# Patient Record
Sex: Male | Born: 1975
Health system: Southern US, Community
[De-identification: ages and names within clinical notes are randomized; demographics above are authoritative.]

## PROBLEM LIST (undated history)

## (undated) DIAGNOSIS — I340 Nonrheumatic mitral (valve) insufficiency: Secondary | ICD-10-CM

## (undated) DIAGNOSIS — Z800 Family history of malignant neoplasm of digestive organs: Secondary | ICD-10-CM

## (undated) DIAGNOSIS — H269 Unspecified cataract: Secondary | ICD-10-CM

## (undated) DIAGNOSIS — I480 Paroxysmal atrial fibrillation: Secondary | ICD-10-CM

## (undated) DIAGNOSIS — I341 Nonrheumatic mitral (valve) prolapse: Secondary | ICD-10-CM

## (undated) DIAGNOSIS — D696 Thrombocytopenia, unspecified: Secondary | ICD-10-CM

## (undated) DIAGNOSIS — E538 Deficiency of other specified B group vitamins: Secondary | ICD-10-CM

## (undated) DIAGNOSIS — I471 Supraventricular tachycardia, unspecified: Secondary | ICD-10-CM

## (undated) DIAGNOSIS — G250 Essential tremor: Secondary | ICD-10-CM

## (undated) DIAGNOSIS — E785 Hyperlipidemia, unspecified: Secondary | ICD-10-CM

## (undated) DIAGNOSIS — H2510 Age-related nuclear cataract, unspecified eye: Secondary | ICD-10-CM

## (undated) DIAGNOSIS — D72819 Decreased white blood cell count, unspecified: Secondary | ICD-10-CM

## (undated) HISTORY — PX: CATARACT EXTRACTION W/ INTRAOCULAR LENS IMPLANT: SHX1309

## (undated) HISTORY — DX: Deficiency of other specified B group vitamins: E53.8

## (undated) HISTORY — DX: Family history of malignant neoplasm of digestive organs: Z80.0

## (undated) HISTORY — DX: Nonrheumatic mitral (valve) insufficiency: I34.0

## (undated) HISTORY — DX: Paroxysmal atrial fibrillation: I48.0

## (undated) HISTORY — DX: Supraventricular tachycardia: I47.1

## (undated) HISTORY — DX: Essential tremor: G25.0

## (undated) HISTORY — DX: Supraventricular tachycardia, unspecified: I47.10

## (undated) HISTORY — DX: Age-related nuclear cataract, unspecified eye: H25.10

## (undated) HISTORY — PX: HIP SURGERY: SHX245

---

## 2017-10-06 ENCOUNTER — Other Ambulatory Visit: Payer: Self-pay

## 2017-10-06 ENCOUNTER — Emergency Department (HOSPITAL_BASED_OUTPATIENT_CLINIC_OR_DEPARTMENT_OTHER): Payer: 59

## 2017-10-06 ENCOUNTER — Encounter (HOSPITAL_BASED_OUTPATIENT_CLINIC_OR_DEPARTMENT_OTHER): Payer: Self-pay | Admitting: *Deleted

## 2017-10-06 ENCOUNTER — Inpatient Hospital Stay (HOSPITAL_BASED_OUTPATIENT_CLINIC_OR_DEPARTMENT_OTHER)
Admission: EM | Admit: 2017-10-06 | Discharge: 2017-10-07 | DRG: 310 | Disposition: A | Discharge status: Home / Self Care | Payer: 59 | Attending: Internal Medicine | Admitting: Internal Medicine

## 2017-10-06 DIAGNOSIS — E7800 Pure hypercholesterolemia, unspecified: Secondary | ICD-10-CM | POA: Diagnosis present

## 2017-10-06 DIAGNOSIS — Z9841 Cataract extraction status, right eye: Secondary | ICD-10-CM

## 2017-10-06 DIAGNOSIS — Z800 Family history of malignant neoplasm of digestive organs: Secondary | ICD-10-CM | POA: Diagnosis not present

## 2017-10-06 DIAGNOSIS — I4891 Unspecified atrial fibrillation: Secondary | ICD-10-CM | POA: Diagnosis present

## 2017-10-06 DIAGNOSIS — E876 Hypokalemia: Secondary | ICD-10-CM | POA: Diagnosis present

## 2017-10-06 DIAGNOSIS — E785 Hyperlipidemia, unspecified: Secondary | ICD-10-CM | POA: Diagnosis present

## 2017-10-06 DIAGNOSIS — I480 Paroxysmal atrial fibrillation: Principal | ICD-10-CM | POA: Diagnosis present

## 2017-10-06 DIAGNOSIS — Z961 Presence of intraocular lens: Secondary | ICD-10-CM | POA: Diagnosis present

## 2017-10-06 DIAGNOSIS — Z881 Allergy status to other antibiotic agents status: Secondary | ICD-10-CM

## 2017-10-06 DIAGNOSIS — D72819 Decreased white blood cell count, unspecified: Secondary | ICD-10-CM | POA: Diagnosis present

## 2017-10-06 DIAGNOSIS — D696 Thrombocytopenia, unspecified: Secondary | ICD-10-CM | POA: Diagnosis present

## 2017-10-06 DIAGNOSIS — I341 Nonrheumatic mitral (valve) prolapse: Secondary | ICD-10-CM | POA: Diagnosis present

## 2017-10-06 HISTORY — DX: Unspecified cataract: H26.9

## 2017-10-06 HISTORY — DX: Hyperlipidemia, unspecified: E78.5

## 2017-10-06 HISTORY — DX: Nonrheumatic mitral (valve) prolapse: I34.1

## 2017-10-06 HISTORY — DX: Thrombocytopenia, unspecified: D69.6

## 2017-10-06 HISTORY — DX: Decreased white blood cell count, unspecified: D72.819

## 2017-10-06 LAB — CBC
HEMATOCRIT: 42.3 % (ref 39.0–52.0)
HEMOGLOBIN: 15.1 g/dL (ref 13.0–17.0)
MCH: 32 pg (ref 26.0–34.0)
MCHC: 35.7 g/dL (ref 30.0–36.0)
MCV: 89.6 fL (ref 78.0–100.0)
Platelets: 212 10*3/uL (ref 150–400)
RBC: 4.72 MIL/uL (ref 4.22–5.81)
RDW: 12.6 % (ref 11.5–15.5)
WBC: 6.7 10*3/uL (ref 4.0–10.5)

## 2017-10-06 LAB — BASIC METABOLIC PANEL
ANION GAP: 12 (ref 5–15)
BUN: 15 mg/dL (ref 6–20)
CHLORIDE: 105 mmol/L (ref 101–111)
CO2: 22 mmol/L (ref 22–32)
Calcium: 8.9 mg/dL (ref 8.9–10.3)
Creatinine, Ser: 0.88 mg/dL (ref 0.61–1.24)
GFR calc non Af Amer: >60 mL/min (ref >60)
Glucose, Bld: 115 mg/dL — ABNORMAL HIGH (ref 65–99)
POTASSIUM: 3.2 mmol/L — AB (ref 3.5–5.1)
SODIUM: 139 mmol/L (ref 135–145)

## 2017-10-06 LAB — MAGNESIUM
MAGNESIUM: 2 mg/dL (ref 1.7–2.4)
MAGNESIUM: 2.1 mg/dL (ref 1.7–2.4)

## 2017-10-06 LAB — TSH: TSH: 2.361 u[IU]/mL (ref 0.350–4.500)

## 2017-10-06 LAB — BRAIN NATRIURETIC PEPTIDE: B Natriuretic Peptide: 36 pg/mL (ref 0.0–100.0)

## 2017-10-06 LAB — TROPONIN I

## 2017-10-06 MED ORDER — POTASSIUM CHLORIDE 10 MEQ/100ML IV SOLN
10.0000 meq | Freq: Once | INTRAVENOUS | Status: AC
  Administered 2017-10-06: 10 meq via INTRAVENOUS
  Filled 2017-10-06: qty 100

## 2017-10-06 MED ORDER — POTASSIUM CHLORIDE CRYS ER 20 MEQ PO TBCR: 40.0000 meq | EXTENDED_RELEASE_TABLET | Freq: Once | ORAL | Status: DC

## 2017-10-06 MED ORDER — ENOXAPARIN SODIUM 40 MG/0.4ML ~~LOC~~ SOLN
40.0000 mg | SUBCUTANEOUS | Status: DC
  Administered 2017-10-06: 40 mg via SUBCUTANEOUS
  Filled 2017-10-06: qty 0.4

## 2017-10-06 MED ORDER — ACETAMINOPHEN 325 MG PO TABS: 650.0000 mg | ORAL_TABLET | ORAL | Status: DC | PRN

## 2017-10-06 MED ORDER — DILTIAZEM HCL 100 MG IV SOLR
5.0000 mg/h | INTRAVENOUS | Status: DC
  Administered 2017-10-06: 5 mg/h via INTRAVENOUS
  Filled 2017-10-06 (×2): qty 100

## 2017-10-06 MED ORDER — DILTIAZEM LOAD VIA INFUSION
10.0000 mg | Freq: Once | INTRAVENOUS | Status: AC
  Administered 2017-10-06: 10 mg via INTRAVENOUS
  Filled 2017-10-06: qty 10

## 2017-10-06 MED ORDER — ONDANSETRON HCL 4 MG/2ML IJ SOLN: 4.0000 mg | Freq: Four times a day (QID) | INTRAMUSCULAR | Status: DC | PRN

## 2017-10-06 NOTE — ED Notes (Signed)
PCXR being done.

## 2017-10-06 NOTE — ED Provider Notes (Signed)
11:27 AM Assumed care from Dr. Wilkie Aye, please see their note for full history, physical and decision making until this point. In brief this is a 42 y.o. year old male who presented to the ED tonight with irregular rhythm     Afib w/ RVR. Admitted. Waiting on bed.  Called multiple times to ascertain bed status at [facility] and not sure when they will have discharges. [facility] d/[facility] medicine at [facility] for [facility].     Labs, studies and imaging reviewed by myself and considered in medical decision making if ordered. Imaging interpreted by radiology.  Labs Reviewed  BASIC METABOLIC PANEL - Abnormal; Notable for the following components:      Result Value   Potassium 3.2 (*)    Glucose, Bld 115 (*)    All other components within normal limits  MAGNESIUM  CBC  BRAIN NATRIURETIC PEPTIDE  TROPONIN I    DG Chest Port 1 View  Final Result      No follow-ups on file.    Marily Memos, MD 10/06/17 [phone]

## 2017-10-06 NOTE — H&P (Signed)
History and Physical    Gordon Norris NWG:[id number] DOB: 08/23/1975 DOA: 10/06/2017  **Will admit patient based on the expectation that the patient will need hospitalization/ hospital care that crosses at least 2 midnights  PCP: Albertina Senegal, MD   Attending physician: Caleb Popp  Patient coming from/Resides with: Private residence/wife and children  Chief Complaint: Palpitations  HPI: Gordon Norris is a 42 y.o. male with medical history significant for long-standing leukopenia/thrombus cytopenia, history of cataracts status post lens replacement, long-standing mild mitral valve prolapse, dyslipidemia not on medications.  Patient awakened about 415 this morning with a sensation of feeling hot and having palpitations with some mild chest discomfort.  Symptoms persisted and he sought care at [facility].  He was found to be in atrial fibrillation with RVR and was started on a Cardizem infusion with improvement in ventricular rate although persistent tachycardia with activity. CHA2DS2-VASc= 0 so was not started on anticoagulation.  Patient transported to this facility for further evaluation and treatment.  Patient reports a remote history of nonspecified tachycardia persisting greater than 36 hours.  He was started on Cardizem by PCP and subsequently referred to unknown cardiologist in [facility] with work-up completed.  He is no longer on Cardizem or other AVN blocking agents.  ED Course:  Vital Signs: BP 123/82   Pulse 95   Temp 97.6 F (36.4 C)   Resp (!) 21   Ht  (1.93 m)   Wt 99.8 kg (220 lb)   SpO2 100%   BMI 26.78 kg/m  Chest x-ray: Unremarkable Lab data: Sodium 139, potassium 3.2, chloride 105, CO2 22, glucose 115, BUN 15, creatinine 0.88, anion gap 12, BNP 36, troponin normal, white count 6700 differential not obtained, hemoglobin 15.1, platelets 212,000 Medications and treatments: Resume 10 mg bolus followed by an infusion at 5 mg/hr, potassium 10 mEq in 100 cc IV bolus x1  Review  of Systems:  In addition to the HPI above,  No Fever-chills, myalgias or other constitutional symptoms No Headache, changes with Vision or hearing, new weakness, tingling, numbness in any extremity, dizziness, dysarthria or word finding difficulty, gait disturbance or imbalance, tremors or seizure activity No problems swallowing food or Liquids, indigestion/reflux, choking or coughing while eating, abdominal pain with or after eating No Chest pain, Cough or Shortness of Breath, orthopnea or DOE No Abdominal pain, N/V, melena,hematochezia, dark tarry stools, constipation No dysuria, malodorous urine, hematuria or flank pain No new skin rashes, lesions, masses or bruises, No new joint pains, aches, swelling or redness No recent unintentional weight gain or loss No polyuria, polydypsia or polyphagia   Past Medical History:  Diagnosis Date  . Cataract   . HLD (hyperlipidemia)   . Leukopenia   . MVP (mitral valve prolapse)   . Thrombocytopenia (HCC)     Past Surgical History:  Procedure Laterality Date  . CATARACT EXTRACTION W/ INTRAOCULAR LENS IMPLANT Right   . HIP SURGERY      Social History   Socioeconomic History  . Marital status: Married    Spouse name: Not on file  . Number of children: Not on file  . Years of education: Not on file  . Highest education level: Not on file  Occupational History  . Not on file  Social Needs  . Financial resource strain: Not on file  . Food insecurity:    Worry: Not on file    Inability: Not on file  . Transportation needs:    Medical: Not on file  Non-medical: Not on file  Tobacco Use  . Smoking status: Never Smoker  . Smokeless tobacco: Never Used  Substance and Sexual Activity  . Alcohol use: Yes    Comment: daily   . Drug use: Never  . Sexual activity: Not on file  Lifestyle  . Physical activity:    Days per week: Not on file    Minutes per session: Not on file  . Stress: Not on file  Relationships  . Social  connections:    Talks on phone: Not on file    Gets together: Not on file    Attends religious service: Not on file    Active member of club or organization: Not on file    Attends meetings of clubs or organizations: Not on file    Relationship status: Not on file  . Intimate partner violence:    Fear of current or ex partner: Not on file    Emotionally abused: Not on file    Physically abused: Not on file    Forced sexual activity: Not on file  Other Topics Concern  . Not on file  Social History Narrative  . Not on file    Mobility: Independent Work history: Not obtained   Allergies  Allergen Reactions  . Erythromycin     Family History  Problem Relation Age of Onset  . Colon cancer Father   . Atrial fibrillation Brother      Prior to Admission medications   Not on File    Physical Exam: Vitals:   10/06/17 1500 10/06/17 1523 10/06/17 1530 10/06/17 1700  BP: 105/62 102/70 119/74 123/82  Pulse: (!) 102 87 95   Resp: (!) 21  Temp:      SpO2: 100% 100% 100%   Weight:      Height:          Constitutional: NAD, calm, comfortable Eyes: PERRL, lids and conjunctivae normal ENMT: Mucous membranes are moist. Posterior pharynx clear of any exudate or lesions.Normal dentition.  Neck: normal, supple, no masses, no thyromegaly Respiratory: clear to auscultation bilaterally, no wheezing, no crackles. Normal respiratory effort. No accessory muscle use.  Cardiovascular: Regular rhythm with underlying atrial fibrillation with ventricular rates~85 bpm resting up to 110 bpm with activity such as sitting forward, no murmurs / rubs / gallops. No extremity edema. 2+ pedal pulses. No carotid bruits.  Cardizem infusion at 5 mg/hr Abdomen: no tenderness, no masses palpated. No hepatosplenomegaly. Bowel sounds positive.  Musculoskeletal: no clubbing / cyanosis. No joint deformity upper and lower extremities. Good ROM, no contractures. Normal muscle tone.  Skin: no rashes,  lesions, ulcers. No induration Neurologic: CN 2-12 grossly intact. Sensation intact, DTR normal. Strength 5/5 x all 4 extremities.  Psychiatric: Normal judgment and insight. Alert and oriented x 3. Normal mood.    Labs on Admission: I have personally reviewed following labs and imaging studies  CBC: Recent Labs  Lab 10/06/17 0509  WBC 6.7  HGB 15.1  HCT 42.3  MCV 89.6  PLT 212   Basic Metabolic Panel: Recent Labs  Lab 10/06/17 0509  NA 139  K 3.2*  CL 105  CO2 22  GLUCOSE 115*  BUN 15  CREATININE 0.88  CALCIUM 8.9  MG 2.0   GFR: Estimated Creatinine Clearance: 134.3 mL/min (by C-G formula based on SCr of 0.88 mg/dL). Liver Function Tests: No results for input(s): AST, ALT, ALKPHOS, BILITOT, PROT, ALBUMIN in the last 168 hours. No results for input(s): LIPASE, AMYLASE in  the last 168 hours. No results for input(s): AMMONIA in the last 168 hours. Coagulation Profile: No results for input(s): INR, PROTIME in the last 168 hours. Cardiac Enzymes: Recent Labs  Lab 10/06/17 0509  TROPONINI <0.03   BNP (last 3 results) No results for input(s): PROBNP in the last 8760 hours. HbA1C: No results for input(s): HGBA1C in the last 72 hours. CBG: No results for input(s): GLUCAP in the last 168 hours. Lipid Profile: No results for input(s): CHOL, HDL, LDLCALC, TRIG, CHOLHDL, LDLDIRECT in the last 72 hours. Thyroid Function Tests: No results for input(s): TSH, T4TOTAL, FREET4, T3FREE, THYROIDAB in the last 72 hours. Anemia Panel: No results for input(s): VITAMINB12, FOLATE, FERRITIN, TIBC, IRON, RETICCTPCT in the last 72 hours. Urine analysis: No results found for: COLORURINE, APPEARANCEUR, LABSPEC, PHURINE, GLUCOSEU, HGBUR, BILIRUBINUR, KETONESUR, PROTEINUR, UROBILINOGEN, NITRITE, LEUKOCYTESUR Sepsis Labs: (procalcitonin:4,lacticidven:4) )No results found for this or any previous visit (from the past 240 hour(s)).   Radiological Exams on Admission: Dg Chest  Port 1 View  Result Date: 10/06/2017 CLINICAL DATA:  42 y/o  M; atrial fibrillation and chest pain. EXAM: PORTABLE CHEST 1 VIEW COMPARISON:  None. FINDINGS: The heart size and mediastinal contours are within normal limits. Both lungs are clear. The visualized skeletal structures are unremarkable. IMPRESSION: No active disease. Electronically Signed   By: Mitzi Hansen M.D.   On: 10/06/2017 06:13    EKG: (Independently reviewed) atrial fibrillation with RVR with either incomplete RBBB versus right axis deviation, ventricular rate 185 bpm, QTC 498 ms, normal R wave rotation, no acute ischemic changes  Assessment/Plan Principal Problem:   Atrial fibrillation with RVR  -Patient presents with new onset A. fib with RVR with improved rate control on IV Cardizem infusion although persistent tachycardia with activity -CHA2DS2-VASC= 0 so no indication to anticoagulate -Patient does have mitral valve prolapse which could potentially lead to A. fib secondary to valvular heart disease therefore if in the future anticoagulation is recommended patient will need to utilize warfarin -Obtain TSH -Keep K+ >/= 4.0 -Consult cardiology in a.m. -Echocardiogram  -Patient denies utilization of substances such as cocaine, herbal supplements, excessive amounts of caffeine  Active Problems:   Acute hypokalemia -Given 10 mEq of potassium IV at [facility] -Oral replete 40 mEq -Magnesium level -Labs in a.m.    MVP (mitral valve prolapse) -Unable to auscultate murmur -Follow-up on echocardiogram    HLD (hyperlipidemia) -Fasting lipid panel    History of leukocytopenia/Thrombocytopenia  -Current white cells and platelets within normal limits    **Additional lab, imaging and/or diagnostic evaluation at discretion of supervising physician  DVT prophylaxis: Lovenox Code Status: Full Family Communication: No family at bedside Disposition Plan: Home Consults called: Recommend consult cardiology in  a.m.    ELLIS,ALLISON L. ANP-BC [facility] Pager [phone]   If 7PM-7AM, please contact night-coverage [email or web address] Password [facility]  10/06/2017, 6:03 PM

## 2017-10-06 NOTE — Progress Notes (Signed)
Called for this 41yo presenting with palpitations to [facility].  He has a h/o thrombocytopenia/leukopenia (currently normal) [facility]  Found to have afib with RVR, started on a Cardizem drip.  He preferred admission to [facility] but there were no beds available.  Will accept for SDU admission to [facility] for further evaluation including echo and cardiology consult.  CHA2DS2-Vasc level is 0 so not anticoagulated.  We do not currently have any SDU beds at this time but will place order.  If a bed comes open sooner at [facility], the patient can be admitted there.  Georgana Curio, M.D.

## 2017-10-06 NOTE — ED Provider Notes (Signed)
[facility] EMERGENCY DEPARTMENT Provider Note   CSN: [id number] Arrival date & time: 10/06/17  0439     History   Chief Complaint Chief Complaint  Patient presents with  . irregular rhythm    HPI Gordon Norris is a 42 y.o. male.  HPI  This is a 42 year old male with a history of mitral valve prolapse and thrombocytopenia who presents with palpations.  Onset of palpitations approximately 1 hour prior to arrival.  He states he has had palpitations before and "I saw a cardiologist for them."  He thinks he may have been atrial fibrillation.  He is not on any anticoagulants or rate reducing medications.  Denies any recent illnesses.  States he went to bed normally.  Denies any chest pain.  He does report some mild shortness of breath.  Denies any recent travel, hospitalization, history of blood clots.  Denies any history of hypertension, smoking.  Does have a history of high cholesterol.  Past Medical History:  Diagnosis Date  . Leukopenia   . MVP (mitral valve prolapse)   . Thrombocytopenia (HCC)     There are no active problems to display for this patient.   Past Surgical History:  Procedure Laterality Date  . HIP SURGERY          Home Medications    Prior to Admission medications   Not on File    Family History No family history on file.  Social History Social History   Tobacco Use  . Smoking status: Never Smoker  . Smokeless tobacco: Never Used  Substance Use Topics  . Alcohol use: Yes    Comment: daily   . Drug use: Never     Allergies   Patient has no known allergies.   Review of Systems Review of Systems  Constitutional: Negative for fever.  Respiratory: Positive for shortness of breath. Negative for cough.   Cardiovascular: Positive for chest pain and palpitations. Negative for leg swelling.  Gastrointestinal: Negative for nausea.  Genitourinary: Negative for dysuria.  All other systems reviewed and are negative.    Physical  Exam Updated Vital Signs BP 128/78   Pulse (!) 104   Temp 97.6 F (36.4 C)   Resp 19   Ht  (1.93 m)   Wt 99.8 kg (220 lb)   SpO2 100%   BMI 26.78 kg/m   Physical Exam  Constitutional: He is oriented to person, place, and time. He appears well-developed and well-nourished.  HENT:  Head: Normocephalic and atraumatic.  Neck: Neck supple.  Cardiovascular: Normal heart sounds.  No murmur heard. Tachycardia in the 120s, irregularly irregular rhythm  Pulmonary/Chest: Effort normal and breath sounds normal. No respiratory distress. He has no wheezes.  Abdominal: Soft. Bowel sounds are normal. There is no tenderness. There is no rebound.  Musculoskeletal: He exhibits no edema.  Lymphadenopathy:    He has no cervical adenopathy.  Neurological: He is alert and oriented to person, place, and time.  Skin: Skin is warm and dry.  Psychiatric: He has a normal mood and affect.  Nursing note and vitals reviewed.    ED Treatments / Results  Labs (all labs ordered are listed, but only abnormal results are displayed) Labs Reviewed  BASIC METABOLIC PANEL - Abnormal; Notable for the following components:      Result Value   Potassium 3.2 (*)    Glucose, Bld 115 (*)    All other components within normal limits  MAGNESIUM  CBC  BRAIN NATRIURETIC PEPTIDE  TROPONIN I    EKG EKG Interpretation  Date/Time:  Monday Oct 06 2017 04:47:42 EDT Ventricular Rate:  185 PR Interval:    QRS Duration: 104 QT Interval:  284 QTC Calculation: 498 R Axis:   -116 Text Interpretation:  Atrial fibrillation with rapid ventricular response Right superior axis deviation Pulmonary disease pattern Nonspecific ST abnormality Abnormal ECG no prior Confirmed by Ross Marcus ([id number]) on 10/06/2017 5:37:56 AM   Radiology Dg Chest Port 1 View  Result Date: 10/06/2017 CLINICAL DATA:  42 y/o  M; atrial fibrillation and chest pain. EXAM: PORTABLE CHEST 1 VIEW COMPARISON:  None. FINDINGS: The heart size and  mediastinal contours are within normal limits. Both lungs are clear. The visualized skeletal structures are unremarkable. IMPRESSION: No active disease. Electronically Signed   By: Mitzi Hansen M.D.   On: 10/06/2017 06:13    Procedures Procedures (including critical care time)  CRITICAL CARE Performed by: Shon Baton   Total critical care time: 35 minutes  Critical care time was exclusive of separately billable procedures and treating other patients.  Critical care was necessary to treat or prevent imminent or life-threatening deterioration.  Critical care was time spent personally by me on the following activities: development of treatment plan with patient and/or surrogate as well as nursing, discussions with consultants, evaluation of patient's response to treatment, examination of patient, obtaining history from patient or surrogate, ordering and performing treatments and interventions, ordering and review of laboratory studies, ordering and review of radiographic studies, pulse oximetry and re-evaluation of patient's condition.   Medications Ordered in ED Medications  diltiazem (CARDIZEM) 1 mg/mL load via infusion 10 mg (10 mg Intravenous Bolus from Bag 10/06/17 0516)    And  diltiazem (CARDIZEM) 100 mg in dextrose 5 % 100 mL (1 mg/mL) infusion (5 mg/hr Intravenous New Bag/Given 10/06/17 0520)  potassium chloride 10 mEq in 100 mL IVPB (has no administration in time range)     Initial Impression / Assessment and Plan / ED Course  I have reviewed the triage vital signs and the nursing notes.  Pertinent labs & imaging results that were available during my care of the patient were reviewed by me and considered in my medical decision making (see chart for details).   This patients CHA2DS2-VASc Score and unadjusted Ischemic Stroke Rate (% per year) is equal to 0.2 % stroke rate/year from a score of 0  Above score calculated as 1 point each if present [CHF, HTN, DM,  Vascular=MI/PAD/Aortic Plaque, Age if 65-74, or Male] Above score calculated as 2 points each if present [Age > 75, or Stroke/TIA/TE]  Clinical Course as of Oct 06 640  Mon Oct 06, 2017  8182 Spoke with Dr. Anselmo Rod at [facility].  No monitored beds available at this time.  However, anticipate monitored beds to open between 8 and 9 AM.  Patient preference is still for [facility].  He is clinically stable on 5 of diltiazem.  Feel it is reasonable to wait for bed to open up.   [CH]    Clinical Course User Index [CH] Daelynn Blower, Mayer Masker, MD   Patient presents with A. fib with RVR.  Unclear whether he has a history.  He does report seeing a cardiologist over 10 years ago who is now retired.  He is overall nontoxic-appearing on exam.  Heart rate in triage 180s.  On my evaluation in the 120s.  Denies any history of coronary artery disease.  ChadsVASC 2 score of 0.  Patient was  given a double bolus and started on a diltiazem drip.  Rates responded nicely into the low 100s.  Work-up including basic metabolic panel, CBC, troponin are all reassuring with the exception of mild hypokalemia at 3.2.  This was replaced.  Patient preference for [facility].  No beds available at this time.  However, anticipate beds this morning.  He is clinically stable.  We will continue to monitor.  Final Clinical Impressions(s) / ED Diagnoses   Final diagnoses:  Atrial fibrillation with RVR [facility])    ED Discharge Orders        Ordered    Amb referral to AFIB Clinic     10/06/17 0506       Shon Baton, MD 10/06/17 [phone]

## 2017-10-06 NOTE — ED Notes (Signed)
EDP notified of pts HR. Verbal order given to continue Cardizem at 5 mg/hr.

## 2017-10-06 NOTE — ED Triage Notes (Addendum)
Pt c/o irregular heart rhythm that started around 40 min ago. Denies any cp/sob. resp even and unlabored. States remote history of same but has not had any recent issues.  HR 120-180  on monitor atrial fib

## 2017-10-06 NOTE — Plan of Care (Signed)
  Problem: Education: Goal: Knowledge of disease or condition will improve Outcome: Progressing Goal: Understanding of medication regimen will improve Outcome: Progressing   Problem: Activity: Goal: Ability to tolerate increased activity will improve Outcome: Progressing   

## 2017-10-06 NOTE — ED Notes (Signed)
Pt states he was awakened earlier this a.m. with the feeling that his heart was racing. Denied CP or other s/s. Taken to ED 10. Placed on monitor. EKG done. IV initiated and labs drawn.

## 2017-10-07 ENCOUNTER — Inpatient Hospital Stay (HOSPITAL_COMMUNITY): Payer: 59

## 2017-10-07 DIAGNOSIS — I4891 Unspecified atrial fibrillation: Secondary | ICD-10-CM

## 2017-10-07 LAB — LIPID PANEL
CHOLESTEROL: 195 mg/dL (ref 0–200)
HDL: 44 mg/dL (ref >40)
LDL Cholesterol: 131 mg/dL — ABNORMAL HIGH (ref 0–99)
TRIGLYCERIDES: 100 mg/dL (ref <150)
Total CHOL/HDL Ratio: 4.4 RATIO
VLDL: 20 mg/dL (ref 0–40)

## 2017-10-07 LAB — BASIC METABOLIC PANEL
Anion gap: 9 (ref 5–15)
BUN: 9 mg/dL (ref 6–20)
CALCIUM: 8.7 mg/dL — AB (ref 8.9–10.3)
CO2: 26 mmol/L (ref 22–32)
CREATININE: 0.77 mg/dL (ref 0.61–1.24)
Chloride: 107 mmol/L (ref 101–111)
GFR calc Af Amer: >60 mL/min (ref >60)
GLUCOSE: 102 mg/dL — AB (ref 65–99)
POTASSIUM: 3.8 mmol/L (ref 3.5–5.1)
SODIUM: 142 mmol/L (ref 135–145)

## 2017-10-07 LAB — ECHOCARDIOGRAM COMPLETE
HEIGHTINCHES: 76 in
WEIGHTICAEL: 3492.8 [oz_av]

## 2017-10-07 LAB — HIV ANTIBODY (ROUTINE TESTING W REFLEX): HIV SCREEN 4TH GENERATION: NONREACTIVE

## 2017-10-07 NOTE — Discharge Summary (Signed)
Physician Discharge Summary  Gordon Norris ZOX:[id number] DOB: 03/28/1976 DOA: 10/06/2017  PCP: Albertina Senegal, MD  Admit date: 10/06/2017 Discharge date: 10/07/2017  Admitted From: Home Disposition: Home  Recommendations for Outpatient Follow-up:  1. Follow up with PCP in 1-2 weeks 2. Please obtain BMP/CBC in one week  Home Health none Equipment/Devices none Discharge Condition stable CODE STATUS full code  Diet recommendation:cardiac Brief/Interim Summary:41 y.o. male with medical history significant for long-standing leukopenia/thrombus cytopenia, history of cataracts status post lens replacement, long-standing mild mitral valve prolapse, dyslipidemia not on medications.  Patient awakened about 415 this morning with a sensation of feeling hot and having palpitations with some mild chest discomfort.  Symptoms persisted and he sought care at [facility].  He was found to be in atrial fibrillation with RVR and was started on a Cardizem infusion with improvement in ventricular rate although persistent tachycardia with activity. CHA2DS2-VASc= 0 so was not started on anticoagulation.  Patient transported to this facility for further evaluation and treatment.  Patient reports a remote history of nonspecified tachycardia persisting greater than 36 hours.  He was started on Cardizem by PCP and subsequently referred to unknown cardiologist in [facility] with work-up completed.  He is no longer on Cardizem or other AVN blocking agents.    Discharge Diagnoses:  Principal Problem:   Atrial fibrillation with RVR (HCC) Active Problems:   MVP (mitral valve prolapse)   HLD (hyperlipidemia)   Acute hypokalemia   Leukocytopenia   Thrombocytopenia (HCC) 1] A. fib RVR-he was started on IV Cardizem upon admission and once a heart rate was controlled Cardizem was stopped sometime in the night.  He is heart rate has been controlled without being on Cardizem at this time.  Cardiology has seen the patient and wants him  to follow-up with the cardiologist as an outpatient for further treatment.  Patient has already made an appointment with the cardiologist at [facility] on June 10.  I have discussed with him to avoid caffeine alcohol and extreme workouts.  His electrolyte levels potassium magnesium was normal.  Lipid panel was normal.  TSH was normal.  Echo showsLeft ventricle: Patient was in sinus rhythm. The cavity size was   normal. Systolic function was normal. Wall motion was normal;   there were no regional wall motion abnormalities. Left   ventricular diastolic function parameters were normal. - Aortic valve: There was trivial regurgitation. Valve area (VTI):   3.08 cm^2. Valve area (Vmax): 3.52 cm^2. Valve area (Vmean): 3.68   cm^2. - Mitral valve: Mild to Moderate, consistent with myxomatous   proliferation. Mild, late systolicprolapse, involving the   anterior leaflet. There was trivial regurgitation. - Atrial septum: The septum bowed from left to right, consistent   with increased left atrial pressure. No defect or patent foramen   ovale was identified.   Discharge Instructions  Discharge Instructions    Amb referral to AFIB Clinic   Complete by:  As directed    Call MD for:  persistant dizziness or light-headedness   Complete by:  As directed    Call MD for:  persistant nausea and vomiting   Complete by:  As directed    Call MD for:  severe uncontrolled pain   Complete by:  As directed    Diet - low sodium heart healthy   Complete by:  As directed    Increase activity slowly   Complete by:  As directed      Allergies as of 10/07/2017  Reactions   Erythromycin       Medication List    You have not been prescribed any medications.    Follow-up Information    Albertina Senegal, MD Follow up.   Specialty:  Internal Medicine Contact information: [address] [place] [address] [phone]        Yates Decamp, MD Follow up.   Specialty:  Cardiology Contact  information: [address] [place] [address] [phone]          Allergies  Allergen Reactions  . Erythromycin     Consultations: cardiology  Procedures/Studies: Dg Chest Port 1 View  Result Date: 10/06/2017 CLINICAL DATA:  42 y/o  M; atrial fibrillation and chest pain. EXAM: PORTABLE CHEST 1 VIEW COMPARISON:  None. FINDINGS: The heart size and mediastinal contours are within normal limits. Both lungs are clear. The visualized skeletal structures are unremarkable. IMPRESSION: No active disease. Electronically Signed   By: Mitzi Hansen M.D.   On: 10/06/2017 06:13    (Echo, Carotid, EGD, Colonoscopy, ERCP)    Subjective:   Discharge Exam: Vitals:   10/07/17 0200 10/07/17 0407  BP:  103/63  Pulse: (!) 59 72  Resp: 15 16  Temp:  98 F (36.7 C)  SpO2: 95% 100%   Vitals:   10/07/17 0039 10/07/17 0100 10/07/17 0200 10/07/17 0407  BP: 103/67   103/63  Pulse: 86 63 (!) 59 72  Resp: Temp: 98.3 F (36.8 C)   98 F (36.7 C)  TempSrc: Oral   Oral  SpO2: 98% 98% 95% 100%  Weight:      Height:        General: Pt is alert, awake, not in acute distress Cardiovascular: RRR, S1/S2 +, no rubs, no gallops Respiratory: CTA bilaterally, no wheezing, no rhonchi Abdominal: Soft, NT, ND, bowel sounds + Extremities: no edema, no cyanosis    The results of significant diagnostics from this hospitalization (including imaging, microbiology, ancillary and laboratory) are listed below for reference.     Microbiology: No results found for this or any previous visit (from the past 240 hour(s)).   Labs: BNP (last 3 results) Recent Labs    10/06/17 0509  BNP 36.0   Basic Metabolic Panel: Recent Labs  Lab 10/06/17 0509 10/06/17 1819 10/07/17 0350  NA 139  --  142  K 3.2*  --  3.8  CL 105  --  107  CO2 22  --  26  GLUCOSE 115*  --  102*  BUN 15  --  9  CREATININE 0.88  --  0.77  CALCIUM 8.9  --  8.7*  MG 2.0 2.1  --     Liver Function Tests: No results for input(s): AST, ALT, ALKPHOS, BILITOT, PROT, ALBUMIN in the last 168 hours. No results for input(s): LIPASE, AMYLASE in the last 168 hours. No results for input(s): AMMONIA in the last 168 hours. CBC: Recent Labs  Lab 10/06/17 0509  WBC 6.7  HGB 15.1  HCT 42.3  MCV 89.6  PLT 212   Cardiac Enzymes: Recent Labs  Lab 10/06/17 0509  TROPONINI <0.03   BNP: Invalid input(s): POCBNP CBG: No results for input(s): GLUCAP in the last 168 hours. D-Dimer No results for input(s): DDIMER in the last 72 hours. Hgb A1c No results for input(s): HGBA1C in the last 72 hours. Lipid Profile Recent Labs    10/07/17 0350  CHOL 195  HDL 44  LDLCALC 131*  TRIG  100  CHOLHDL 4.4   Thyroid function studies Recent Labs    10/06/17 1819  TSH 2.361   Anemia work up No results for input(s): VITAMINB12, FOLATE, FERRITIN, TIBC, IRON, RETICCTPCT in the last 72 hours. Urinalysis No results found for: COLORURINE, APPEARANCEUR, LABSPEC, PHURINE, GLUCOSEU, HGBUR, BILIRUBINUR, KETONESUR, PROTEINUR, UROBILINOGEN, NITRITE, LEUKOCYTESUR Sepsis Labs Invalid input(s): PROCALCITONIN,  WBC,  LACTICIDVEN Microbiology No results found for this or any previous visit (from the past 240 hour(s)).   Time coordinating discharge: 33 minutes  SIGNED:   Alwyn Ren, MD  [facility] 10/07/2017, 12:42 PM Pager   If 7PM-7AM, please contact night-coverage [email or web address] Password [email or web address]

## 2017-10-07 NOTE — Progress Notes (Signed)
  Echocardiogram 2D Echocardiogram has been performed.  Gordon Norris 10/07/2017, 11:34 AM

## 2017-10-07 NOTE — Consult Note (Signed)
CARDIOLOGY CONSULT NOTE  Patient ID: Gordon Norris MRN: [id number] DOB/AGE: [birth year] 43 y.o.  Admit date: 10/06/2017 Referring Physician  Blanchard Mane, MD Primary Physician:  Albertina Senegal, MD Reason for Consultation  A. Fib  HPI: Gordon Norris  is a 42 y.o. male  With history of very mild thrombocytopenia, mitral valve prolapse, told to have paroxysmal atrial fibrillation about 12 to 14 years ago, has not seen a physician cardiologist in a long time, essentially asymptomatic and leads a healthy lifestyle woke up yesterday with rapid palpitations.  He was found to be in A. fib with RVR.  He was started on diltiazem, he converted to sinus rhythm this morning around 1 AM.  Essentially asymptomatic now.  He has past medical history of hyperlipidemia.  No history of hypertension, diabetes.  He does not smoke.  Past Medical History:  Diagnosis Date  . Cataract   . HLD (hyperlipidemia)   . Leukopenia   . MVP (mitral valve prolapse)   . Thrombocytopenia (HCC)      Past Surgical History:  Procedure Laterality Date  . CATARACT EXTRACTION W/ INTRAOCULAR LENS IMPLANT Right   . HIP SURGERY       Family History  Problem Relation Age of Onset  . Colon cancer Father   . Atrial fibrillation Brother      Social History: Social History   Socioeconomic History  . Marital status: Married    Spouse name: Not on file  . Number of children: Not on file  . Years of education: Not on file  . Highest education level: Not on file  Occupational History  . Not on file  Social Needs  . Financial resource strain: Not on file  . Food insecurity:    Worry: Not on file    Inability: Not on file  . Transportation needs:    Medical: Not on file    Non-medical: Not on file  Tobacco Use  . Smoking status: Never Smoker  . Smokeless tobacco: Never Used  Substance and Sexual Activity  . Alcohol use: Yes    Comment: daily   . Drug use: Never  . Sexual activity: Not on file  Lifestyle  .  Physical activity:    Days per week: Not on file    Minutes per session: Not on file  . Stress: Not on file  Relationships  . Social connections:    Talks on phone: Not on file    Gets together: Not on file    Attends religious service: Not on file    Active member of club or organization: Not on file    Attends meetings of clubs or organizations: Not on file    Relationship status: Not on file  . Intimate partner violence:    Fear of current or ex partner: Not on file    Emotionally abused: Not on file    Physically abused: Not on file    Forced sexual activity: Not on file  Other Topics Concern  . Not on file  Social History Narrative  . Not on file     No medications prior to admission.   Review of Systems  Constitutional: Negative.   HENT: Negative.   Eyes: Negative.   Respiratory: Negative.   Cardiovascular: Positive for palpitations.  Gastrointestinal: Negative.   Genitourinary: Negative.   Musculoskeletal: Negative.   Skin: Negative.   Neurological: Negative.   Endo/Heme/Allergies: Negative.   Psychiatric/Behavioral: Negative.   All other systems reviewed and are negative.   Physical  Exam: Blood pressure 103/63, pulse 72, temperature 98 F (36.7 C), temperature source Oral, resp. rate 16, height  (1.93 m), weight 99 kg (218 lb 4.8 oz), SpO2 100 %.   Physical Exam  Constitutional: He is oriented to person, place, and time and well-developed, well-nourished, and in no distress.  HENT:  Head: Atraumatic.  Eyes: Conjunctivae are normal.  Neck: Normal range of motion. Neck supple. No JVD present. No thyromegaly present.  Cardiovascular: Normal rate, regular rhythm and intact distal pulses.  S1 and S2 is normal, midsystolic click present.  No murmur.  Pulmonary/Chest: Effort normal and breath sounds normal.  Abdominal: Soft. Bowel sounds are normal.  Musculoskeletal: Normal range of motion. He exhibits no edema.  Neurological: He is alert and oriented to  person, place, and time.  Skin: Skin is warm and dry.  Psychiatric: Affect normal.   Labs:   Lab Results  Component Value Date   WBC 6.7 10/06/2017   HGB 15.1 10/06/2017   HCT 42.3 10/06/2017   MCV 89.6 10/06/2017   PLT 212 10/06/2017    Recent Labs  Lab 10/07/17 0350  NA 142  K 3.8  CL 107  CO2 26  BUN 9  CREATININE 0.77  CALCIUM 8.7*  GLUCOSE 102*    Lipid Panel     Component Value Date/Time   CHOL 195 10/07/2017 0350   TRIG 100 10/07/2017 0350   HDL 44 10/07/2017 0350   CHOLHDL 4.4 10/07/2017 0350   VLDL 20 10/07/2017 0350   LDLCALC 131 (H) 10/07/2017 0350    BNP (last 3 results) Recent Labs    10/06/17 0509  BNP 36.0  Cardiac Panel (last 3 results) Recent Labs    10/06/17 0509  TROPONINI <0.03    Lab Results  Component Value Date   TROPONINI <0.03 10/06/2017     TSH Recent Labs    10/06/17 1819  TSH 2.361     Radiology: Dg Chest Port 1 View  Result Date: 10/06/2017 CLINICAL DATA:  42 y/o  M; atrial fibrillation and chest pain. EXAM: PORTABLE CHEST 1 VIEW COMPARISON:  None. FINDINGS: The heart size and mediastinal contours are within normal limits. Both lungs are clear. The visualized skeletal structures are unremarkable. IMPRESSION: No active disease. Electronically Signed   By: Mitzi Hansen M.D.   On: 10/06/2017 06:13    Scheduled Meds: . enoxaparin (LOVENOX) injection  40 mg Subcutaneous Q24H  . potassium chloride  40 mEq Oral Once   Continuous Infusions: . diltiazem (CARDIZEM) infusion Stopped (10/07/17 0218)   PRN Meds:.acetaminophen, ondansetron (ZOFRAN) IV  CARDIAC STUDIES: EKG 10/07/2015: Atrial fibrillation with rapid ventricular response, right superior axis, pulmonary disease pattern with incomplete right bundle branch block.  No ST-T wave changes of ischemia.  Normal QT interval.  EKG 10/07/2017: Normal sinus rhythm, leftward axis, incomplete right bundle branch block.  Normal QT interval.  Echocardiogram  10/07/2017: Normal LV systolic function, EF 55 to 60%.  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  Normal left atrial size.  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  No obvious PFO or ASD.  ASSESSMENT AND PLAN:  1.  Paroxysmal atrial fibrillation with rapid ventricular response CHA2DS2-VASCScore: Risk Score  0, 2.  Mild hyperlipidemia 3.  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  Recommendation: Patient can be discharged home today with outpatient follow-up.  He  will benefit from being on flecainide along with diltiazem on a as needed basis for breakthrough A. fib.  It appears that his last episode of A. fib was several years ago.  With regard to hyperlipidemia, we could certainly consider statin therapy. Platelet count normal.   Yates Decamp, MD 10/07/2017, 11:50 AM [facility] Pager: [phone] Office: [phone] If no answer Cell [phone]

## 2017-10-09 ENCOUNTER — Telehealth (HOSPITAL_COMMUNITY): Payer: Self-pay | Admitting: *Deleted

## 2017-10-09 NOTE — Telephone Encounter (Signed)
Pt was offered appt to afib clinic from referral put in ED.  Pt refused appt since he is establishing with Dr. Sampson Goon in [place] and did not start any daily or maintenance meds.  Pt thanked [place] for calling

## 2018-02-23 ENCOUNTER — Encounter (HOSPITAL_BASED_OUTPATIENT_CLINIC_OR_DEPARTMENT_OTHER): Payer: Self-pay | Admitting: Emergency Medicine

## 2018-02-23 ENCOUNTER — Other Ambulatory Visit: Payer: Self-pay

## 2018-02-23 ENCOUNTER — Emergency Department (HOSPITAL_BASED_OUTPATIENT_CLINIC_OR_DEPARTMENT_OTHER)
Admission: EM | Admit: 2018-02-23 | Discharge: 2018-02-23 | Disposition: A | Discharge status: Home / Self Care | Payer: 59 | Attending: Emergency Medicine | Admitting: Emergency Medicine

## 2018-02-23 ENCOUNTER — Emergency Department (HOSPITAL_BASED_OUTPATIENT_CLINIC_OR_DEPARTMENT_OTHER): Payer: 59

## 2018-02-23 DIAGNOSIS — S61531A Puncture wound without foreign body of right wrist, initial encounter: Secondary | ICD-10-CM | POA: Diagnosis not present

## 2018-02-23 DIAGNOSIS — W540XXA Bitten by dog, initial encounter: Secondary | ICD-10-CM | POA: Insufficient documentation

## 2018-02-23 DIAGNOSIS — Z23 Encounter for immunization: Secondary | ICD-10-CM | POA: Diagnosis not present

## 2018-02-23 DIAGNOSIS — Y9389 Activity, other specified: Secondary | ICD-10-CM | POA: Insufficient documentation

## 2018-02-23 DIAGNOSIS — Y999 Unspecified external cause status: Secondary | ICD-10-CM | POA: Insufficient documentation

## 2018-02-23 DIAGNOSIS — S61511A Laceration without foreign body of right wrist, initial encounter: Secondary | ICD-10-CM | POA: Diagnosis not present

## 2018-02-23 DIAGNOSIS — Y92009 Unspecified place in unspecified non-institutional (private) residence as the place of occurrence of the external cause: Secondary | ICD-10-CM | POA: Insufficient documentation

## 2018-02-23 DIAGNOSIS — S61551A Open bite of right wrist, initial encounter: Secondary | ICD-10-CM | POA: Diagnosis present

## 2018-02-23 MED ORDER — TETANUS-DIPHTH-ACELL PERTUSSIS 5-2.5-18.5 LF-MCG/0.5 IM SUSP
0.5000 mL | Freq: Once | INTRAMUSCULAR | Status: AC
  Administered 2018-02-23: 0.5 mL via INTRAMUSCULAR
  Filled 2018-02-23: qty 0.5

## 2018-02-23 MED ORDER — IBUPROFEN 400 MG PO TABS
400.0000 mg | ORAL_TABLET | Freq: Once | ORAL | Status: AC
  Administered 2018-02-23: 400 mg via ORAL
  Filled 2018-02-23: qty 1

## 2018-02-23 MED ORDER — LIDOCAINE-EPINEPHRINE (PF) 2 %-1:200000 IJ SOLN
INTRAMUSCULAR | Status: AC
  Filled 2018-02-23: qty 10

## 2018-02-23 MED ORDER — AMOXICILLIN-POT CLAVULANATE 875-125 MG PO TABS: 1.0000 | ORAL_TABLET | Freq: Two times a day (BID) | ORAL | 0 refills | Status: DC

## 2018-02-23 MED ORDER — LIDOCAINE-EPINEPHRINE (PF) 2 %-1:200000 IJ SOLN
20.0000 mL | Freq: Once | INTRAMUSCULAR | Status: AC
  Administered 2018-02-23: 10 mL
  Filled 2018-02-23: qty 20

## 2018-02-23 MED ORDER — AMOXICILLIN-POT CLAVULANATE 875-125 MG PO TABS
1.0000 | ORAL_TABLET | Freq: Once | ORAL | Status: AC
  Administered 2018-02-23: 1 via ORAL
  Filled 2018-02-23: qty 1

## 2018-02-23 MED FILL — AMOX-CLAV 875-125 MG TABLET: 875-125 | 4 days supply | Qty: 8 | Fill #0

## 2018-02-23 NOTE — ED Provider Notes (Signed)
[facility] EMERGENCY DEPARTMENT Provider Note   CSN: [id number] Arrival date & time: 02/23/18  [id number]     History   Chief Complaint Chief Complaint  Patient presents with  . Animal Bite    HPI Gordon Norris is a 42 y.o. male.  Patient s/p dog bite to right wrist. Is his pet, the two dogs were fighting over food, and he got in middle. Dogs acting healthy/normally. Dogs imms are all up to date. pts last tetanus unknown. Laceration to dorsum right wrist, gaping, 3-4 cm, and puncture to volar surface. Pain moderate, dull, non radiating. No associated numbness/weakness. Can move all digits normally. Denies other pain or injury. No fevers.   The history is provided by the patient.  Animal Bite  Associated symptoms: no fever and no numbness     Past Medical History:  Diagnosis Date  . Cataract   . HLD (hyperlipidemia)   . Leukopenia   . MVP (mitral valve prolapse)   . Thrombocytopenia [facility](HCC)     Patient Active Problem List   Diagnosis Date Noted  . Atrial fibrillation with RVR (HCC) 10/06/2017  . MVP (mitral valve prolapse) 10/06/2017  . HLD (hyperlipidemia) 10/06/2017  . Acute hypokalemia 10/06/2017  . Leukocytopenia 10/06/2017  . Thrombocytopenia (HCC) 10/06/2017    Past Surgical History:  Procedure Laterality Date  . CATARACT EXTRACTION W/ INTRAOCULAR LENS IMPLANT Right   . HIP SURGERY          Home Medications    Prior to Admission medications   Not on File    Family History Family History  Problem Relation Age of Onset  . Colon cancer Father   . Atrial fibrillation Brother     Social History Social History   Tobacco Use  . Smoking status: Never Smoker  . Smokeless tobacco: Never Used  Substance Use Topics  . Alcohol use: Yes    Comment: daily   . Drug use: Never     Allergies   Erythromycin   Review of Systems Review of Systems  Constitutional: Negative for fever.  Gastrointestinal: Negative for nausea and vomiting.    Musculoskeletal:       Wound/pain to right wrist.   Skin: Positive for wound.  Neurological: Negative for weakness and numbness.  Psychiatric/Behavioral: The patient is nervous/anxious.      Physical Exam Updated Vital Signs BP 122/81   Pulse 81   Temp 98.1 F (36.7 C) (Oral)   Resp 16   Ht 1.93 m (6\' 4" )   Wt 97.5 kg   SpO2 100%   BMI 26.17 kg/m   Physical Exam  Constitutional: He appears well-developed and well-nourished.  HENT:  Head: Atraumatic.  Eyes: Conjunctivae are normal.  Neck: Neck supple. No tracheal deviation present.  Cardiovascular: Normal rate and intact distal pulses.  Pulmonary/Chest: Effort normal. No accessory muscle usage. No respiratory distress.  Abdominal: He exhibits no distension.  Musculoskeletal:  Wounds to right wrist, c/w dog bite. 3.5 cm gaping wound to dorsum right wrist. No fb/gross contamination seen. Puncture wound to volar aspect right wrist. Radial pulse 2+.   Neurological: He is alert.  Speech clear/fluent. RUE and hand - rad/med/uln fxn intact, motor and sens.   Skin: Skin is warm and dry. No rash noted.  Laceration and puncture wounds to right wrist.   Psychiatric:  Mildly anxious.   Nursing note and vitals reviewed.    ED Treatments / Results  Labs (all labs ordered are listed, but only abnormal results are  displayed) Labs Reviewed - No data to display  EKG None  Radiology Dg Wrist Complete Right  Result Date: 02/23/2018 CLINICAL DATA:  Pt was bitten by his dog this morning while feeding him on his right wrist, open wound on top and bottom of right wrist EXAM: RIGHT WRIST - COMPLETE 3+ VIEW COMPARISON:  None. FINDINGS: No acute fracture or dislocation. Soft tissue injuries about the wrist, as evidenced by soft tissue gas. No radiopaque foreign object. IMPRESSION: Soft tissue injuries, without acute osseous finding. Electronically Signed   By: Jeronimo Greaves M.D.   On: 02/23/2018 07:50    Procedures .Marland KitchenLaceration  Repair Date/Time: 02/23/2018 7:33 AM Performed by: Cathren Laine, MD Authorized by: Cathren Laine, MD   Consent:    Risks discussed:  Infection, retained foreign body, tendon damage, nerve damage and poor wound healing Anesthesia (see MAR for exact dosages):    Anesthesia method:  Local infiltration   Local anesthetic:  Lidocaine 2% WITH epi Laceration details:    Location:  Hand   Hand location:  R wrist   Length (cm):  3.5 Repair type:    Repair type:  Intermediate Pre-procedure details:    Preparation:  Patient was prepped and draped in usual sterile fashion and imaging obtained to evaluate for foreign bodies Exploration:    Wound exploration: wound explored through full range of motion and entire depth of wound probed and visualized   Treatment:    Area cleansed with:  Betadine and saline   Amount of cleaning:  Extensive   Irrigation solution:  Sterile saline   Visualized foreign bodies/material removed: no   Skin repair:    Repair method:  Sutures   Suture size:  4-0   Suture material:  Prolene   Suture technique:  Simple interrupted   Number of sutures:  3 Approximation:    Approximation:  Loose Post-procedure details:    Dressing:  Antibiotic ointment and sterile dressing   Patient tolerance of procedure:  Tolerated well, no immediate complications   (including critical care time)  Medications Ordered in ED Medications  Tdap (BOOSTRIX) injection 0.5 mL (has no administration in time range)  lidocaine-EPINEPHrine (XYLOCAINE W/EPI) 2 %-1:200000 (PF) injection 20 mL (has no administration in time range)  amoxicillin-clavulanate (AUGMENTIN) 875-125 MG per tablet 1 tablet (has no administration in time range)     Initial Impression / Assessment and Plan / ED Course  I have reviewed the triage vital signs and the nursing notes.  Pertinent labs & imaging results that were available during my care of the patient were reviewed by me and considered in my medical decision  making (see chart for details).  Wound cleaned. xrays obtained.   Due to large/gaping nature of wound to dorsum wrist, will close wound/loosely approximate - see lac repair note.   Tetanus im.   augmentin po.  xrays reviewed - no fb. Discussed w pt.   Sterile dressing applied.  Motrin po.  Recheck hand, nvi w rad/med/uln fxn intact.     Final Clinical Impressions(s) / ED Diagnoses   Final diagnoses:  None    ED Discharge Orders    None       Cathren Laine, MD 02/23/18 [phone]

## 2018-02-23 NOTE — ED Triage Notes (Signed)
Dog bite this morning from patient's own dogs.  Laceration noted to forearm.  No active bleeding at present.

## 2018-02-23 NOTE — Discharge Instructions (Addendum)
It was our pleasure to provide your ER care today - we hope that you feel better.  Keep wound clean/dry. May shower, pad area gently dry.  Take augmentin (antibiotic) as prescribed.   Take acetaminophen and/or ibuprofen as need for pain.   Have sutures removed, your doctor or urgent care, in 7-10 days.   Return to ER if worse, new symptoms, fevers, infection of wound, numbness/weakness, severe pain, other concern.

## 2020-05-02 ENCOUNTER — Other Ambulatory Visit: Payer: Self-pay

## 2020-05-02 ENCOUNTER — Ambulatory Visit (INDEPENDENT_AMBULATORY_CARE_PROVIDER_SITE_OTHER): Payer: 59 | Admitting: Family Medicine

## 2020-05-02 ENCOUNTER — Ambulatory Visit: Payer: Self-pay

## 2020-05-02 ENCOUNTER — Ambulatory Visit (HOSPITAL_BASED_OUTPATIENT_CLINIC_OR_DEPARTMENT_OTHER)
Admission: RE | Admit: 2020-05-02 | Discharge: 2020-05-02 | Disposition: A | Discharge status: Home / Self Care | Payer: 59 | Source: Ambulatory Visit | Attending: Family Medicine | Admitting: Family Medicine

## 2020-05-02 ENCOUNTER — Encounter: Payer: Self-pay | Admitting: Family Medicine

## 2020-05-02 VITALS — BP 134/84 | HR 76 | Ht 76.0 in | Wt 225.0 lb

## 2020-05-02 DIAGNOSIS — S92422A Displaced fracture of distal phalanx of left great toe, initial encounter for closed fracture: Secondary | ICD-10-CM | POA: Diagnosis present

## 2020-05-02 DIAGNOSIS — M79675 Pain in left toe(s): Secondary | ICD-10-CM

## 2020-05-02 DIAGNOSIS — S92422D Displaced fracture of distal phalanx of left great toe, subsequent encounter for fracture with routine healing: Secondary | ICD-10-CM | POA: Insufficient documentation

## 2020-05-02 NOTE — Patient Instructions (Signed)
Nice to meet you Please try the shoe with walks  Please try buddy tapping through the course of the day  Please use ice as needed   Please send me a message in MyChart with any questions or updates.  Please see me back in 3 weeks.   --Dr. Jordan Likes

## 2020-05-02 NOTE — Progress Notes (Signed)
Gordon Norris - 44 y.o. male MRN [id number]  Date of birth: 1975-07-09  SUBJECTIVE:  Including CC & ROS.  Chief Complaint  Patient presents with  . Toe Injury    left Great toe x 4 weeks    Gordon Norris is a 44 y.o. male that is presenting with left great toe pain.  The pain has been ongoing for about a month.  He great toe behind his toe stepping on a hardwood floor.  Since that time he has had swelling and pain.   Review of Systems See HPI   HISTORY: Past Medical, Surgical, Social, and Family History Reviewed & Updated per EMR.   Pertinent Historical Findings include:  Past Medical History:  Diagnosis Date  . Cataract   . HLD (hyperlipidemia)   . Leukopenia   . MVP (mitral valve prolapse)   . Thrombocytopenia (HCC)     Past Surgical History:  Procedure Laterality Date  . CATARACT EXTRACTION W/ INTRAOCULAR LENS IMPLANT Right   . HIP SURGERY      Family History  Problem Relation Age of Onset  . Colon cancer Father   . Atrial fibrillation Brother     Social History   Socioeconomic History  . Marital status: Married    Spouse name: Not on file  . Number of children: Not on file  . Years of education: Not on file  . Highest education level: Not on file  Occupational History  . Not on file  Tobacco Use  . Smoking status: Never Smoker  . Smokeless tobacco: Never Used  Substance and Sexual Activity  . Alcohol use: Yes    Comment: daily   . Drug use: Never  . Sexual activity: Not on file  Other Topics Concern  . Not on file  Social History Narrative  . Not on file   Social Determinants of Health   Financial Resource Strain:   . Difficulty of Paying Living Expenses: Not on file  Food Insecurity:   . Worried About Programme researcher, broadcasting/film/video in the Last Year: Not on file  . Ran Out of Food in the Last Year: Not on file  Transportation Needs:   . Lack of Transportation (Medical): Not on file  . Lack of Transportation (Non-Medical): Not on file  Physical  Activity:   . Days of Exercise per Week: Not on file  . Minutes of Exercise per Session: Not on file  Stress:   . Feeling of Stress : Not on file  Social Connections:   . Frequency of Communication with Friends and Family: Not on file  . Frequency of Social Gatherings with Friends and Family: Not on file  . Attends Religious Services: Not on file  . Active Member of Clubs or Organizations: Not on file  . Attends Banker Meetings: Not on file  . Marital Status: Not on file  Intimate Partner Violence:   . Fear of Current or Ex-Partner: Not on file  . Emotionally Abused: Not on file  . Physically Abused: Not on file  . Sexually Abused: Not on file     PHYSICAL EXAM:  VS: BP 134/84   Pulse 76   Ht 6\' 4"  (1.93 m)   Wt 225 lb (102.1 kg)   BMI 27.39 kg/m  Physical Exam Gen: NAD, alert, cooperative with exam, well-appearing MSK:  Left great toe: Obvious swelling of the proximal phalanx. Limited flexion and extension. Tenderness palpation proximal phalanx. Neurovascular intact  Limited ultrasound: Left great toe:  Normal-appearing  first MTP joint. There appears to be a obliquely mildly displaced fracture of the proximal phalanx with increased hyperemia throughout this area.  Summary: Findings consistent with a mildly displaced proximal phalanx oblique fracture  Ultrasound and interpretation by Clare Gandy, MD    ASSESSMENT & PLAN:   Displaced fracture of distal phalanx of left great toe, initial encounter for closed fracture Initial injury was roughly 4 weeks ago.  Appears to have ongoing healing on ultrasound with callus formation on the midshaft portion. -Counseled on home exercise therapy supportive care. -Counseled on buddy taping. -X-ray. -Follow-up in 3 weeks.

## 2020-05-02 NOTE — Assessment & Plan Note (Signed)
Initial injury was roughly 4 weeks ago.  Appears to have ongoing healing on ultrasound with callus formation on the midshaft portion. -Counseled on home exercise therapy supportive care. -Counseled on buddy taping. -X-ray. -Follow-up in 3 weeks.

## 2020-05-03 ENCOUNTER — Telehealth: Payer: Self-pay | Admitting: Family Medicine

## 2020-05-03 NOTE — Telephone Encounter (Signed)
Informed of results.   Myra Rude, MD [facility] 05/03/2020, 5:26 PM

## 2020-05-23 ENCOUNTER — Ambulatory Visit: Payer: 59 | Admitting: Family Medicine

## 2020-05-24 ENCOUNTER — Ambulatory Visit (INDEPENDENT_AMBULATORY_CARE_PROVIDER_SITE_OTHER): Payer: 59 | Admitting: Family Medicine

## 2020-05-24 ENCOUNTER — Other Ambulatory Visit: Payer: Self-pay

## 2020-05-24 ENCOUNTER — Ambulatory Visit (HOSPITAL_BASED_OUTPATIENT_CLINIC_OR_DEPARTMENT_OTHER)
Admission: RE | Admit: 2020-05-24 | Discharge: 2020-05-24 | Disposition: A | Discharge status: Home / Self Care | Payer: 59 | Source: Ambulatory Visit | Attending: Family Medicine | Admitting: Family Medicine

## 2020-05-24 DIAGNOSIS — S92422D Displaced fracture of distal phalanx of left great toe, subsequent encounter for fracture with routine healing: Secondary | ICD-10-CM

## 2020-05-24 NOTE — Assessment & Plan Note (Addendum)
Initial injury was around 11/1. Has had improvement with the post op shoe.  His function has gotten better. -Continue postop shoe until pain has resolved. -X-ray.

## 2020-05-24 NOTE — Patient Instructions (Signed)
Good to see you Please use ice as needed  Please use the post op shoe until your toe is non tender to touch  I will call with the results.  Comics explained on Youtube.   Please send me a message in MyChart with any questions or updates.  Please see [place] back as needed.   --Dr. Jordan Likes

## 2020-05-24 NOTE — Progress Notes (Signed)
  [name] - 44 y.o. male MRN [id number]  Date of birth: 12-14-1975  SUBJECTIVE:  Including CC & ROS.  Chief Complaint  Patient presents with  . Follow-up    Gordon Norris is a 44 y.o. male that is following up for his left great toe fracture.  He has got improvement with the postop shoe.  The swelling has improved.  The range of motion is improved.  Still has pain intermittently.   Review of Systems See HPI   HISTORY: Past Medical, Surgical, Social, and Family History Reviewed & Updated per EMR.   Pertinent Historical Findings include:  Past Medical History:  Diagnosis Date  . Cataract   . HLD (hyperlipidemia)   . Leukopenia   . MVP (mitral valve prolapse)   . Thrombocytopenia (HCC)     Past Surgical History:  Procedure Laterality Date  . CATARACT EXTRACTION W/ INTRAOCULAR LENS IMPLANT Right   . HIP SURGERY      Family History  Problem Relation Age of Onset  . Colon cancer Father   . Atrial fibrillation Brother     Social History   Socioeconomic History  . Marital status: Married    Spouse name: Not on file  . Number of children: Not on file  . Years of education: Not on file  . Highest education level: Not on file  Occupational History  . Not on file  Tobacco Use  . Smoking status: Never Smoker  . Smokeless tobacco: Never Used  Substance and Sexual Activity  . Alcohol use: Yes    Comment: daily   . Drug use: Never  . Sexual activity: Not on file  Other Topics Concern  . Not on file  Social History Narrative  . Not on file   Social Determinants of Health   Financial Resource Strain: Not on file  Food Insecurity: Not on file  Transportation Needs: Not on file  Physical Activity: Not on file  Stress: Not on file  Social Connections: Not on file  Intimate Partner Violence: Not on file     PHYSICAL EXAM:  VS: BP (!) 136/96   Ht 6\' 4"  (1.93 m)   Wt 230 lb (104.3 kg)   BMI 28.00 kg/m  Physical Exam Gen: NAD, alert, cooperative with exam,  well-appearing    ASSESSMENT & PLAN:   Displaced fracture of distal phalanx of left great toe, subsequent encounter for fracture with routine healing Initial injury was around 11/1. Has had improvement with the post op shoe.  His function has gotten better. -Continue postop shoe until pain has resolved. -X-ray.

## 2020-05-25 ENCOUNTER — Telehealth: Payer: Self-pay | Admitting: Family Medicine

## 2020-05-25 NOTE — Telephone Encounter (Signed)
Informed of results.   Myra Rude, MD [facility] 05/25/2020, 8:27 AM

## 2021-01-17 ENCOUNTER — Ambulatory Visit (INDEPENDENT_AMBULATORY_CARE_PROVIDER_SITE_OTHER): Payer: 59 | Admitting: Family Medicine

## 2021-01-17 ENCOUNTER — Encounter: Payer: Self-pay | Admitting: Family Medicine

## 2021-01-17 ENCOUNTER — Other Ambulatory Visit: Payer: Self-pay

## 2021-01-17 VITALS — BP 100/70 | Ht 76.0 in | Wt 225.0 lb

## 2021-01-17 DIAGNOSIS — M545 Low back pain, unspecified: Secondary | ICD-10-CM

## 2021-01-17 DIAGNOSIS — S161XXA Strain of muscle, fascia and tendon at neck level, initial encounter: Secondary | ICD-10-CM
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
## 2021-01-17 NOTE — Patient Instructions (Signed)
Good to see you Please try heat  Please try the exercises   Please send me a message in MyChart with any questions or updates.  Please see me back in 4 weeks or as needed if better.   --Dr. Alireza Pollack  
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
## 2021-02-14 IMAGING — DX DG TOE GREAT 2+V*L*
3 series · 3 of 3 positions shown · non-contrast
Comparison: Plain films left great toe 05/02/2020.

CLINICAL DATA: The patient suffered a left great toe fracture in a
fall down stairs approximately 6 weeks ago.

EXAM:
LEFT GREAT TOE

[toe ap]
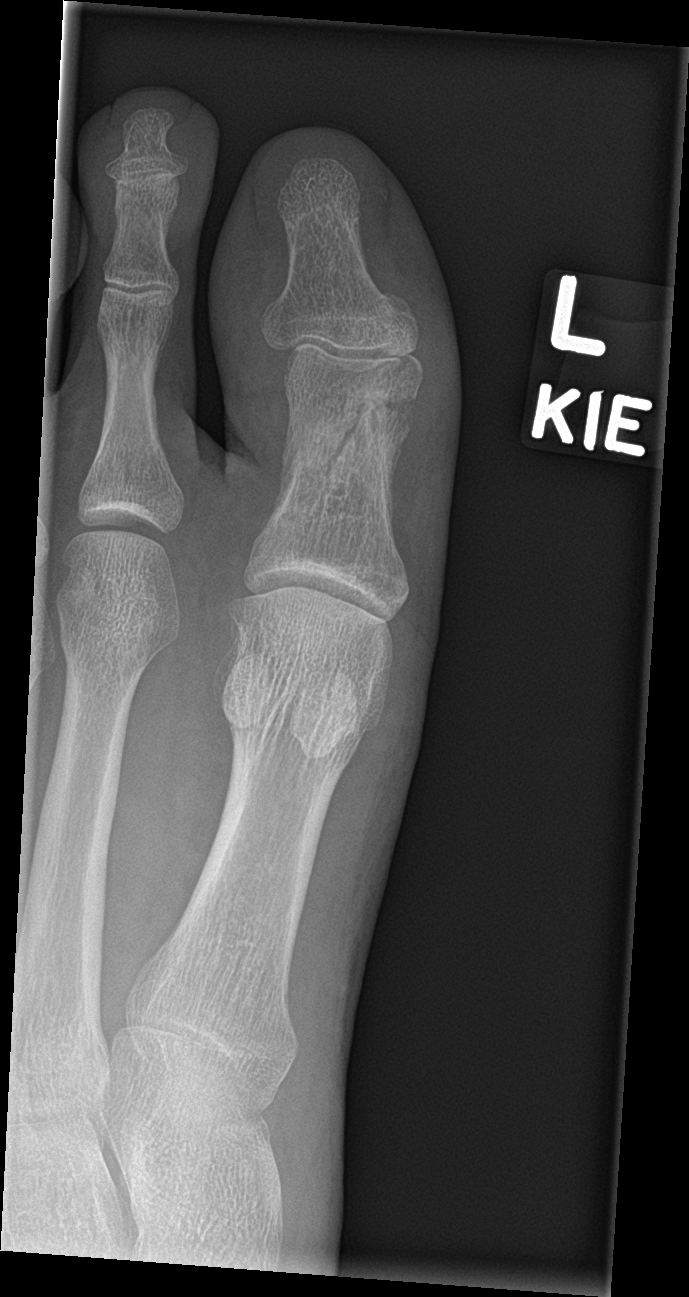

[toe obl]
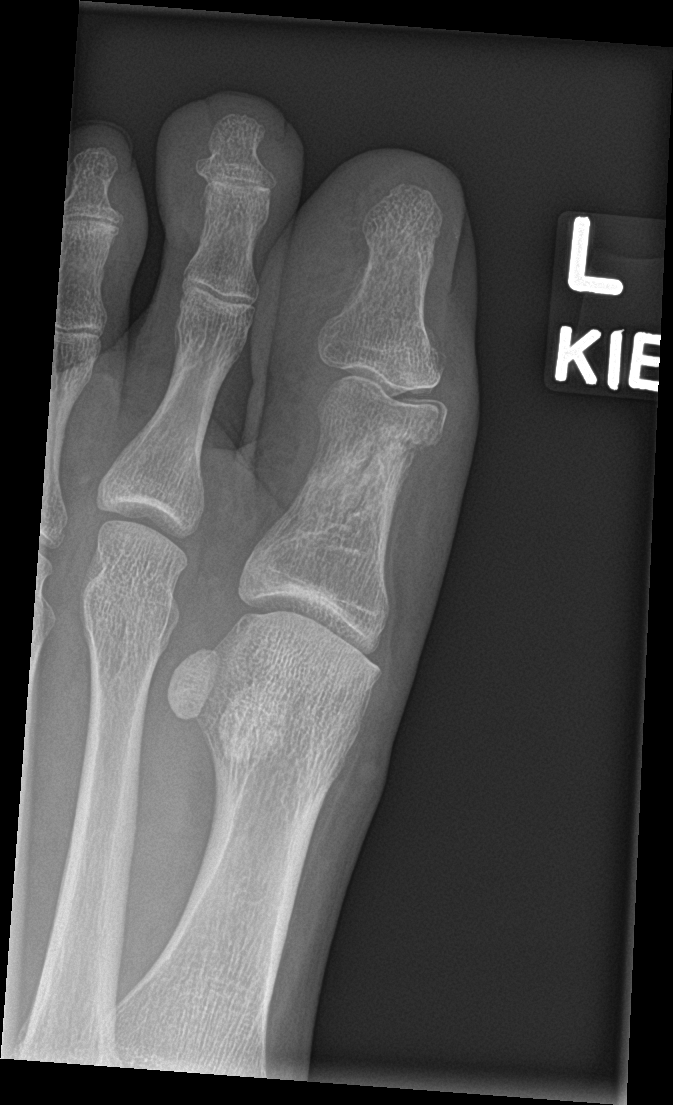

[toe lat]
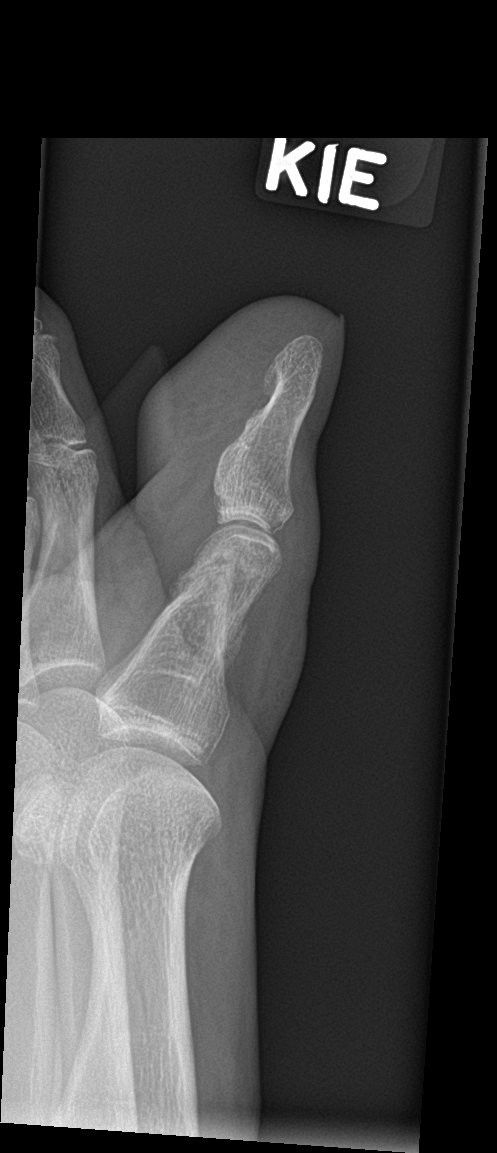

[3 of 3 positions shown; findings below may reference images not displayed]

FINDINGS: Fracture of the proximal phalanx of the great toe extending from
lateral metaphysis of the proximal phalanx of the medial periphery
of the distal metaphysis and articular surface is again seen. There
is little to no displacement. No change in position or alignment is
identified. There is some bony resorption about the distal fracture
and callus formation about the plantar and dorsal margins of the
fracture consistent with healing. No new abnormality is identified.
IMPRESSION: Healing fracture of the proximal phalanx of the great toe without
change in position or alignment.

## 2021-03-05 ENCOUNTER — Institutional Professional Consult (permissible substitution): Payer: 59 | Admitting: Cardiology

## 2021-04-03 ENCOUNTER — Other Ambulatory Visit: Payer: Self-pay

## 2021-04-03 ENCOUNTER — Ambulatory Visit (INDEPENDENT_AMBULATORY_CARE_PROVIDER_SITE_OTHER): Payer: 59 | Admitting: Cardiology

## 2021-04-03 VITALS — BP 126/82 | HR 84 | Ht 76.0 in | Wt 229.0 lb

## 2021-04-03 DIAGNOSIS — I471 Supraventricular tachycardia: Secondary | ICD-10-CM | POA: Diagnosis not present

## 2021-04-03 MED ORDER — DILTIAZEM HCL 60 MG PO TABS: 60.0000 mg | ORAL_TABLET | ORAL | 1 refills | Status: DC | PRN

## 2021-04-03 NOTE — Patient Instructions (Signed)
Medication Instructions:  Your physician recommends that you continue on your current medications as directed. Please refer to the Current Medication list given to you today.  *If you need a refill on your cardiac medications before your next appointment, please call your pharmacy*   Lab Work: None ordered If you have labs (blood work) drawn today and your tests are completely normal, you will receive your results only by: . MyChart Message (if you have MyChart) OR . A paper copy in the mail If you have any lab test that is abnormal or we need to change your treatment, we will call you to review the results.   Testing/Procedures: None ordered   Follow-Up: At [facility], you and your health needs are our priority.  As part of our continuing mission to provide you with exceptional heart care, we have created designated Provider Care Teams.  These Care Teams include your primary Cardiologist (physician) and Advanced Practice Providers (APPs -  Physician Assistants and Nurse Practitioners) who all work together to provide you with the care you need, when you need it.  We recommend signing up for the patient portal called "MyChart".  Sign up information is provided on this After Visit Summary.  MyChart is used to connect with patients for Virtual Visits (Telemedicine).  Patients are able to view lab/test results, encounter notes, upcoming appointments, etc.  Non-urgent messages can be sent to your provider as well.   To learn more about what you can do with MyChart, go to [email or web address].    Your next appointment:   1 year(s)  The format for your next appointment:   In Person  Provider:   Will Camnitz, MD   Thank you for choosing [facility]!!   Adelita Hone, RN [phone]    Other Instructions    

## 2021-04-03 NOTE — Progress Notes (Signed)
Electrophysiology Office Note   Date:  04/03/2021   ID:  Gordon Norris, DOB 04-14-1976, MRN [id number]  PCP:  No primary care provider on file.  Cardiologist:   Primary Electrophysiologist:  Gordon Loveland Jorja Loa, MD    Chief Complaint: SVT, AF   History of Present Illness: Gordon Norris is a 45 y.o. male who is being seen today for the evaluation of SVT, AF at the request of Podraza, Harrell Lark*. Presenting today for electrophysiology evaluation.  He has a history significant for mild mitral gravitation.  He has had episodes of SVT.  SVT has terminated with carotid massage.  He is on a pill in the pocket and diltiazem.  His episodes last between 5 to 10 minutes.  He is currently on metoprolol for a tremor.  He has had rare episodes of SVT.  He has episodes of SVT when he drinks too much alcohol or is overexerting himself.  He went on a hike recently and had trouble getting his heart rate to come back down to normal.  He took a diltiazem which improved his heart rate and helped his palpitations.  He does get some chest pain that is on the left side of his chest and is an aching dull pain.  It is not associated with exertion and occurs with rest.  Today, he denies symptoms of palpitations, chest pain, shortness of breath, orthopnea, PND, lower extremity edema, claudication, dizziness, presyncope, syncope, bleeding, or neurologic sequela. The patient is tolerating medications without difficulties.    Past Medical History:  Diagnosis Date   B12 deficiency    Cataract    Essential tremor    Family history of colon cancer    HLD (hyperlipidemia)    Leukopenia    MVP (mitral valve prolapse)    Non-rheumatic mitral regurgitation    Nuclear sclerosis    Paroxysmal atrial fibrillation (HCC)    Paroxysmal SVT (supraventricular tachycardia) (HCC)    Thrombocytopenia (HCC)    Past Surgical History:  Procedure Laterality Date   CATARACT EXTRACTION W/ INTRAOCULAR LENS IMPLANT Right     HIP SURGERY       Current Outpatient Medications  Medication Sig Dispense Refill   diltiazem (CARDIZEM) 60 MG tablet Take 60 mg by mouth as needed.     metoprolol succinate (TOPROL-XL) 25 MG 24 hr tablet Take 25 mg by mouth at bedtime.     vitamin B-12 (CYANOCOBALAMIN) 1000 MCG tablet Take 1,000 mcg by mouth daily.     No current facility-administered medications for this visit.    Allergies:   Erythromycin   Social History:  The patient  reports that he has never smoked. He has never used smokeless tobacco. He reports current alcohol use. He reports that he does not use drugs.   Family History:  The patient's family history includes Atrial fibrillation in his brother; Colon cancer in his father.    ROS:  Please see the history of present illness.   Otherwise, review of systems is positive for none.   All other systems are reviewed and negative.    PHYSICAL EXAM: VS:  BP 126/82   Pulse 84   Ht 6\' 4"  (1.93 m)   Wt 229 lb (103.9 kg)   SpO2 99%   BMI 27.87 kg/m  , BMI Body mass index is 27.87 kg/m. GEN: Well nourished, well developed, in no acute distress  HEENT: normal  Neck: no JVD, carotid bruits, or masses Cardiac: RRR; no murmurs, rubs, or gallops,no edema  Respiratory:  clear to auscultation bilaterally, normal work of breathing GI: soft, nontender, nondistended, + BS MS: no deformity or atrophy  Skin: warm and dry Neuro:  Strength and sensation are intact Psych: euthymic mood, full affect  EKG:  EKG is ordered today. Personal review of the ekg ordered shows sinus rhythm, rate 84  Recent Labs: No results found for requested labs within last 8760 hours.    Lipid Panel     Component Value Date/Time   CHOL 195 10/07/2017 0350   TRIG 100 10/07/2017 0350   HDL 44 10/07/2017 0350   CHOLHDL 4.4 10/07/2017 0350   VLDL 20 10/07/2017 0350   LDLCALC 131 (H) 10/07/2017 0350     Wt Readings from Last 3 Encounters:  04/03/21 229 lb (103.9 kg)  01/17/21 225 lb  (102.1 kg)  05/24/20 230 lb (104.3 kg)      Other studies Reviewed: Additional studies/ records that were reviewed today include: TTE 10/07/20 Review of the above records today demonstrates:  - Left ventricle: Patient was in sinus rhythm. The cavity size was    normal. Systolic function was normal. Wall motion was normal;    there were no regional wall motion abnormalities. Left    ventricular diastolic function parameters were normal.  - Aortic valve: There was trivial regurgitation. Valve area (VTI):    3.08 cm^2. Valve area (Vmax): 3.52 cm^2. Valve area (Vmean): 3.68    cm^2.  - Mitral valve: Mild to Moderate, consistent with myxomatous    proliferation. Mild, late systolicprolapse, involving the    anterior leaflet. There was trivial regurgitation.  - Atrial septum: The septum bowed from left to right, consistent    with increased left atrial pressure. No defect or patent foramen    ovale was identified.   ASSESSMENT AND PLAN:  1.  SVT: Episode is terminated with carotid massage.  He has diltiazem as a pill in the pocket.  He is also on low-dose metoprolol for a tremor.  He has rare symptoms of SVT.  They always terminate when he takes his diltiazem.  He is overall comfortable with his care.  We [name] continue with current management.  2.  Paroxysmal atrial fibrillation: CHA2DS2-VASc of 0.  Currently not anticoagulated.  3.  Chest pain: Pain appears atypical.  He is able to exert himself without issue.  Likely noncardiac.  Current medicines are reviewed at length with the patient today.   The patient does not have concerns regarding his medicines.  The following changes were made today:  none  Labs/ tests ordered today include:  Orders Placed This Encounter  Procedures   EKG 12-Lead     Disposition:   FU with [name]  Signed, [name], MD  04/03/2021 10:52 AM     [address] [place]  [address] [phone] (office) [phone] (fax)

## 2021-04-03 NOTE — Addendum Note (Signed)
Addended by: Baird Lyons on: 04/03/2021 10:53 AM   Modules accepted: Orders

## 2021-04-30 ENCOUNTER — Other Ambulatory Visit: Payer: Self-pay | Admitting: Cardiology

## 2021-08-22 ENCOUNTER — Ambulatory Visit: Payer: 59 | Admitting: Family Medicine

## 2021-08-29 ENCOUNTER — Encounter: Payer: Self-pay | Admitting: Family Medicine

## 2021-08-29 ENCOUNTER — Other Ambulatory Visit (HOSPITAL_BASED_OUTPATIENT_CLINIC_OR_DEPARTMENT_OTHER): Payer: Self-pay

## 2021-08-29 ENCOUNTER — Ambulatory Visit (INDEPENDENT_AMBULATORY_CARE_PROVIDER_SITE_OTHER): Payer: 59 | Admitting: Family Medicine

## 2021-08-29 VITALS — BP 120/70 | Ht 76.0 in | Wt 229.0 lb

## 2021-08-29 DIAGNOSIS — S39012A Strain of muscle, fascia and tendon of lower back, initial encounter: Secondary | ICD-10-CM | POA: Diagnosis not present

## 2021-08-29 MED ORDER — KETOROLAC TROMETHAMINE 30 MG/ML IJ SOLN
30.0000 mg | Freq: Once | INTRAMUSCULAR | Status: AC
  Administered 2021-08-29: 30 mg via INTRAMUSCULAR

## 2021-08-29 MED ORDER — HYDROCODONE-ACETAMINOPHEN 5-325 MG PO TABS
1.0000 | ORAL_TABLET | Freq: Three times a day (TID) | ORAL | 0 refills | Status: DC | PRN
  Filled 2021-08-29: qty 10, 4d supply, fill #0

## 2021-08-29 MED ORDER — METHYLPREDNISOLONE ACETATE 40 MG/ML IJ SUSP
40.0000 mg | Freq: Once | INTRAMUSCULAR | Status: AC
  Administered 2021-08-29: 40 mg via INTRAMUSCULAR

## 2021-08-29 NOTE — Patient Instructions (Signed)
Good to see you ?Please try heat  ?Please try the exercises  ?Please check out bodyhelix and the back compression  ?Please send me a message in MyChart with any questions or updates.  ?Please see me back as needed.  ? ?--Dr. Jordan Likes ? ?

## 2021-08-29 NOTE — Progress Notes (Signed)
?  Gordon Norris - 46 y.o. male MRN [id number]  Date of birth: [birth year] ? ?SUBJECTIVE:  Including CC & ROS.  ?No chief complaint on file. ? ? ?Gordon Norris is a 46 y.o. male that is presenting with acute bilateral lower back pain.  The pain has been severe in nature.  He has severe back pain with bending over transitioning from sitting to standing.  No history of surgery.  No radicular pain. ? ? ?Review of Systems ?See HPI  ? ?HISTORY: Past Medical, Surgical, Social, and Family History Reviewed & Updated per EMR.   ?Pertinent Historical Findings include: ? ?Past Medical History:  ?Diagnosis Date  ? B12 deficiency   ? Cataract   ? Essential tremor   ? Family history of colon cancer   ? HLD (hyperlipidemia)   ? Leukopenia   ? MVP (mitral valve prolapse)   ? Non-rheumatic mitral regurgitation   ? Nuclear sclerosis   ? Paroxysmal atrial fibrillation (HCC)   ? Paroxysmal SVT (supraventricular tachycardia) (HCC)   ? Thrombocytopenia ([place])   ? ? ?Past Surgical History:  ?Procedure Laterality Date  ? CATARACT EXTRACTION W/ INTRAOCULAR LENS IMPLANT Right   ? HIP SURGERY    ? ? ? ?PHYSICAL EXAM:  ?VS: BP 120/70 (BP Location: Right Arm, Patient Position: Sitting)   Ht 6\' 4"  (1.93 m)   Wt 229 lb (103.9 kg)   BMI 27.87 kg/m?  ?Physical Exam ?Gen: NAD, alert, cooperative with exam, well-appearing ?MSK:  ?Neurovascularly intact   ? ? ? ? ?ASSESSMENT & PLAN:  ? ?Strain of lumbar region ?Acutely occurring.  Having severe pain.  Likely having a spasm component ?-Counseled on home exercise therapy and supportive care. ?-IM Toradol and Depo-Medrol. ?-Norco. ?-Could consider physical therapy or further imaging. ? ? ? ? ?

## 2021-08-29 NOTE — Assessment & Plan Note (Signed)
Acutely occurring.  Having severe pain.  Likely having a spasm component ?-Counseled on home exercise therapy and supportive care. ?-IM Toradol and Depo-Medrol. ?-Norco. ?-Could consider physical therapy or further imaging. ?

## 2022-04-22 ENCOUNTER — Ambulatory Visit: Payer: 59 | Attending: Cardiology | Admitting: Cardiology

## 2022-04-22 ENCOUNTER — Encounter: Payer: Self-pay | Admitting: Cardiology

## 2022-04-22 VITALS — BP 112/72 | HR 72 | Ht 76.0 in | Wt 231.1 lb

## 2022-04-22 DIAGNOSIS — I471 Supraventricular tachycardia, unspecified: Secondary | ICD-10-CM | POA: Diagnosis not present

## 2022-04-22 NOTE — Progress Notes (Signed)
Electrophysiology Office Note   Date:  04/22/2022   ID:  [name], [name] 1975-12-21, MRN [id number]  PCP:  Bailey Mech, PA-C  Cardiologist:   Primary Electrophysiologist:  Tiffny Gemmer Jorja Loa, MD    Chief Complaint: SVT, AF   History of Present Illness: Gordon Norris is a 46 y.o. male who is being seen today for the evaluation of SVT, AF at the request of Podraza, Harrell Lark*. Presenting today for electrophysiology evaluation.  He has a history significant for SVT and atrial fibrillation.  He has had episodes of SVT that have terminated with carotid massage.  He is on a pill in the pocket diltiazem.  Episodes of lasted between 5 and 10 minutes.  He is also on metoprolol for a tremor.  He has SVT when he drinks too much alcohol or is overexerting himself.  Today, denies symptoms of palpitations, chest pain, shortness of breath, orthopnea, PND, lower extremity edema, claudication, dizziness, presyncope, syncope, bleeding, or neurologic sequela. The patient is tolerating medications without difficulties.  He is overall doing well.  He continues to have minimal episodes of SVT.  He is overall quite happy with his control.  He does mention that he is going on a hiking trip to the bottom of the [facility] in April.  He has had episodes of SVT when he is hiking.  He is happy to take his diltiazem on the trip.   Past Medical History:  Diagnosis Date   B12 deficiency    Cataract    Essential tremor    Family history of colon cancer    HLD (hyperlipidemia)    Leukopenia    MVP (mitral valve prolapse)    Non-rheumatic mitral regurgitation    Nuclear sclerosis    Paroxysmal atrial fibrillation (HCC)    Paroxysmal SVT (supraventricular tachycardia)    Thrombocytopenia (HCC)    Past Surgical History:  Procedure Laterality Date   CATARACT EXTRACTION W/ INTRAOCULAR LENS IMPLANT Right    HIP SURGERY       Current Outpatient Medications  Medication Sig Dispense  Refill   Ascorbic Acid (VITAMIN C PO) Take 1 tablet by mouth daily.     diltiazem (CARDIZEM) 60 MG tablet TAKE 1 TABLET (60 MG TOTAL) BY MOUTH AS NEEDED. 30 tablet 6   metoprolol succinate (TOPROL-XL) 25 MG 24 hr tablet Take 25 mg by mouth at bedtime.     vitamin B-12 (CYANOCOBALAMIN) 1000 MCG tablet Take 1,000 mcg by mouth daily.     HYDROcodone-acetaminophen (NORCO/VICODIN) 5-325 MG tablet Take 1 tablet by mouth every 8 (eight) hours as needed. 10 tablet 0   No current facility-administered medications for this visit.    Allergies:   Erythromycin   Social History:  The patient  reports that he has never smoked. He has never used smokeless tobacco. He reports current alcohol use. He reports that he does not use drugs.   Family History:  The patient's family history includes Atrial fibrillation in his brother; Colon cancer in his father.   ROS:  Please see the history of present illness.   Otherwise, review of systems is positive for none.   All other systems are reviewed and negative.   PHYSICAL EXAM: VS:  BP 112/72   Pulse 72   Ht 6\' 4"  (1.93 m)   Wt 231 lb 1.9 oz (104.8 kg)   SpO2 99%   BMI 28.13 kg/m  , BMI Body mass index is 28.13 kg/m. GEN: Well nourished, well developed, in  no acute distress  HEENT: normal  Neck: no JVD, carotid bruits, or masses Cardiac: RRR; no murmurs, rubs, or gallops,no edema  Respiratory:  clear to auscultation bilaterally, normal work of breathing GI: soft, nontender, nondistended, + BS MS: no deformity or atrophy  Skin: warm and dry Neuro:  Strength and sensation are intact Psych: euthymic mood, full affect  EKG:  EKG is ordered today. Personal review of the ekg ordered shows sinus rhythm, right superior axis, rate 72  Recent Labs: No results found for requested labs within last 365 days.    Lipid Panel     Component Value Date/Time   CHOL 195 10/07/2017 0350   TRIG 100 10/07/2017 0350   HDL 44 10/07/2017 0350   CHOLHDL 4.4 10/07/2017  0350   VLDL 20 10/07/2017 0350   LDLCALC 131 (H) 10/07/2017 0350     Wt Readings from Last 3 Encounters:  04/22/22 231 lb 1.9 oz (104.8 kg)  08/29/21 229 lb (103.9 kg)  04/03/21 229 lb (103.9 kg)      Other studies Reviewed: Additional studies/ records that were reviewed today include: TTE 10/07/20 Review of the above records today demonstrates:  - Left ventricle: Patient was in sinus rhythm. The cavity size was    normal. Systolic function was normal. Wall motion was normal;    there were no regional wall motion abnormalities. Left    ventricular diastolic function parameters were normal.  - Aortic valve: There was trivial regurgitation. Valve area (VTI):    3.08 cm^2. Valve area (Vmax): 3.52 cm^2. Valve area (Vmean): 3.68    cm^2.  - Mitral valve: Mild to Moderate, consistent with myxomatous    proliferation. Mild, late systolicprolapse, involving the    anterior leaflet. There was trivial regurgitation.  - Atrial septum: The septum bowed from left to right, consistent    with increased left atrial pressure. No defect or patent foramen    ovale was identified.   ASSESSMENT AND PLAN:  1.  SVT: Episodes terminated with carotid massage and vagal maneuvers.  He is on diltiazem as a pill in the pocket strategy.  He is also on low-dose metoprolol for tremor.  He has rare symptoms of SVT.  Comfortable with his care.  No changes.  2.  Paroxysmal atrial fibrillation: CHA2DS2-VASc of 0.  Not anticoagulated.  No changes.   Current medicines are reviewed at length with the patient today.   The patient does not have concerns regarding his medicines.  The following changes were made today:  none  Labs/ tests ordered today include:  Orders Placed This Encounter  Procedures   EKG 12-Lead     Disposition:   FU with Octavia Velador 1 year  Signed, Calieb Lichtman Jorja Loa, MD  04/22/2022 2:12 PM     [facility] [address] [place] [address] [phone]  (office) [phone] (fax)

## 2022-04-22 NOTE — Patient Instructions (Signed)
Medication Instructions:  Your physician recommends that you continue on your current medications as directed. Please refer to the Current Medication list given to you today.  *If you need a refill on your cardiac medications before your next appointment, please call your pharmacy*   Lab Work: None ordered   Testing/Procedures: None ordered   Follow-Up: At [facility], you and your health needs are our priority.  As part of our continuing mission to provide you with exceptional heart care, we have created designated Provider Care Teams.  These Care Teams include your primary Cardiologist (physician) and Advanced Practice Providers (APPs -  Physician Assistants and Nurse Practitioners) who all work together to provide you with the care you need, when you need it.  Your next appointment:   1 year(s)  The format for your next appointment:   In Person  Provider:   Will Camnitz, MD    Thank you for choosing [facility]!!   Syeda Prickett, RN [phone]  Other Instructions   Important Information About Sugar           

## 2022-09-16 ENCOUNTER — Encounter: Payer: Self-pay | Admitting: *Deleted

## 2023-07-14 ENCOUNTER — Ambulatory Visit: Payer: 59 | Attending: Cardiology | Admitting: Cardiology

## 2023-07-14 ENCOUNTER — Encounter: Payer: Self-pay | Admitting: Cardiology

## 2023-07-14 VITALS — BP 110/70 | HR 65 | Ht 76.0 in | Wt 220.1 lb

## 2023-07-14 DIAGNOSIS — I471 Supraventricular tachycardia, unspecified: Secondary | ICD-10-CM

## 2023-07-14 DIAGNOSIS — I480 Paroxysmal atrial fibrillation: Secondary | ICD-10-CM

## 2023-07-14 NOTE — Progress Notes (Signed)
  Electrophysiology Office Note:   Date:  07/14/2023  ID:  Gordon Norris, DOB 11-20-1975, MRN [id number]  Primary Cardiologist: None Primary Heart Failure: None Electrophysiologist: Alcus Bradly Cortland Ding, MD      History of Present Illness:   Gordon Norris is a 48 y.o. male with h/o SVT, atrial fibrillation seen today for routine electrophysiology followup.   Since last being seen in our clinic the patient reports doing well.  He has noted no further episodes of SVT.  He has been quite active over the last year.  He hiked down into the [facility] spent the night, and hiked back up.  He had no SVT no atrial fibrillation.  He is unaware of arrhythmias.  He has not needed to take his as needed diltiazem ..  he denies chest pain, palpitations, dyspnea, PND, orthopnea, nausea, vomiting, dizziness, syncope, edema, weight gain, or early satiety.   Review of systems complete and found to be negative unless listed in HPI.   EP Information / Studies Reviewed:    EKG is ordered today. Personal review as below.  EKG Interpretation Date/Time:  Monday July 14 2023 16:16:55 EST Ventricular Rate:  65 PR Interval:  150 QRS Duration:  110 QT Interval:  410 QTC Calculation: 426 R Axis:   253  Text Interpretation: Normal sinus rhythm Right superior axis deviation When compared with ECG of 07-Oct-2017 04:03, No significant change was found Confirmed by Akeema Broder ([id number]) on 07/14/2023 4:39:12 PM     Risk Assessment/Calculations:    CHA2DS2-VASc Score = 0   This indicates a 0.2% annual risk of stroke. The patient's score is based upon: CHF History: 0 HTN History: 0 Diabetes History: 0 Stroke History: 0 Vascular Disease History: 0 Age Score: 0 Gender Score: 0              Physical Exam:   VS:  BP 110/70   Pulse 65   Ht 6\' 4"  (1.93 m)   Wt 220 lb 1.3 oz (99.8 kg)   SpO2 97%   BMI 26.79 kg/m    Wt Readings from Last 3 Encounters:  07/14/23 220 lb 1.3 oz (99.8 kg)  04/22/22 231 lb  1.9 oz (104.8 kg)  08/29/21 229 lb (103.9 kg)     GEN: Well nourished, well developed in no acute distress NECK: No JVD; No carotid bruits CARDIAC: Regular rate and rhythm, no murmurs, rubs, gallops RESPIRATORY:  Clear to auscultation without rales, wheezing or rhonchi  ABDOMEN: Soft, non-tender, non-distended EXTREMITIES:  No edema; No deformity   ASSESSMENT AND PLAN:    1.  SVT: Terminates with carotid sinus massage and vagal maneuvers.  Is currently on both metoprolol and as needed diltiazem .  He has not had to take his diltiazem  recently.  Comfortable with his care.  No changes.  2.  Paroxysmal atrial fibrillation.  No recent episodes.  Not anticoagulated due to low stroke risk.  Continue with current management.  Follow up with Dr. Lawana Pray in 12 months  Signed, Anaiya Wisinski Cortland Ding, MD
